# Patient Record
Sex: Male | Born: 1970 | Race: White | Hispanic: No | Marital: Married | Smoking: Never smoker
Health system: Southern US, Community
[De-identification: ages and names within clinical notes are randomized; demographics above are authoritative.]

## PROBLEM LIST (undated history)

## (undated) HISTORY — PX: INNER EAR SURGERY: SHX679

---

## 2016-06-22 ENCOUNTER — Emergency Department (HOSPITAL_COMMUNITY)
Admission: EM | Admit: 2016-06-22 | Discharge: 2016-06-22 | Disposition: A | Discharge status: Home / Self Care | Payer: Managed Care, Other (non HMO) | Attending: Emergency Medicine | Admitting: Emergency Medicine

## 2016-06-22 ENCOUNTER — Encounter (HOSPITAL_COMMUNITY): Payer: Self-pay | Admitting: Emergency Medicine

## 2016-06-22 ENCOUNTER — Emergency Department (HOSPITAL_COMMUNITY): Payer: Managed Care, Other (non HMO)

## 2016-06-22 DIAGNOSIS — Y929 Unspecified place or not applicable: Secondary | ICD-10-CM | POA: Insufficient documentation

## 2016-06-22 DIAGNOSIS — S0502XA Injury of conjunctiva and corneal abrasion without foreign body, left eye, initial encounter: Secondary | ICD-10-CM | POA: Insufficient documentation

## 2016-06-22 DIAGNOSIS — F17228 Nicotine dependence, chewing tobacco, with other nicotine-induced disorders: Secondary | ICD-10-CM | POA: Insufficient documentation

## 2016-06-22 DIAGNOSIS — Y999 Unspecified external cause status: Secondary | ICD-10-CM | POA: Insufficient documentation

## 2016-06-22 DIAGNOSIS — M795 Residual foreign body in soft tissue: Secondary | ICD-10-CM

## 2016-06-22 DIAGNOSIS — T1490XA Injury, unspecified, initial encounter: Secondary | ICD-10-CM

## 2016-06-22 DIAGNOSIS — Y939 Activity, unspecified: Secondary | ICD-10-CM | POA: Diagnosis not present

## 2016-06-22 DIAGNOSIS — S0591XA Unspecified injury of right eye and orbit, initial encounter: Secondary | ICD-10-CM | POA: Diagnosis present

## 2016-06-22 DIAGNOSIS — S0501XA Injury of conjunctiva and corneal abrasion without foreign body, right eye, initial encounter: Secondary | ICD-10-CM

## 2016-06-22 DIAGNOSIS — S40851A Superficial foreign body of right upper arm, initial encounter: Secondary | ICD-10-CM | POA: Diagnosis not present

## 2016-06-22 DIAGNOSIS — W228XXA Striking against or struck by other objects, initial encounter: Secondary | ICD-10-CM | POA: Diagnosis not present

## 2016-06-22 MED ORDER — ERYTHROMYCIN 5 MG/GM OP OINT: TOPICAL_OINTMENT | OPHTHALMIC | 0 refills | Status: AC

## 2016-06-22 MED ORDER — ERYTHROMYCIN 5 MG/GM OP OINT
TOPICAL_OINTMENT | Freq: Once | OPHTHALMIC | Status: AC
  Administered 2016-06-22: 17:00:00 via OPHTHALMIC
  Filled 2016-06-22: qty 3.5

## 2016-06-22 MED ORDER — CEPHALEXIN 500 MG PO CAPS
500.0000 mg | ORAL_CAPSULE | Freq: Four times a day (QID) | ORAL | 0 refills | Status: AC
Start: 2016-06-22 — End: ?

## 2016-06-22 MED ORDER — TETRACAINE HCL 0.5 % OP SOLN
2.0000 [drp] | Freq: Once | OPHTHALMIC | Status: AC
  Administered 2016-06-22: 2 [drp] via OPHTHALMIC
  Filled 2016-06-22: qty 4

## 2016-06-22 MED ORDER — HYDROCODONE-ACETAMINOPHEN 5-325 MG PO TABS
1.0000 | ORAL_TABLET | Freq: Once | ORAL | Status: AC
  Administered 2016-06-22: 1 via ORAL
  Filled 2016-06-22: qty 1

## 2016-06-22 MED ORDER — FLUORESCEIN SODIUM 1 MG OP STRP
1.0000 | ORAL_STRIP | Freq: Once | OPHTHALMIC | Status: AC
  Administered 2016-06-22: 1 via OPHTHALMIC
  Filled 2016-06-22: qty 1

## 2016-06-22 MED ORDER — KETOROLAC TROMETHAMINE 0.5 % OP SOLN: 1.0000 [drp] | Freq: Four times a day (QID) | OPHTHALMIC | 0 refills | Status: AC

## 2016-06-22 MED ORDER — IBUPROFEN 600 MG PO TABS
600.0000 mg | ORAL_TABLET | Freq: Four times a day (QID) | ORAL | 0 refills | Status: AC | PRN
Start: 2016-06-22 — End: ?

## 2016-06-22 MED ORDER — FLUORESCEIN SODIUM 1 MG OP STRP
3.0000 | ORAL_STRIP | Freq: Once | OPHTHALMIC | Status: AC
  Administered 2016-06-22: 3 via OPHTHALMIC
  Filled 2016-06-22: qty 3
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
## 2016-06-22 NOTE — Discharge Instructions (Signed)
You have metal in your soft tissue of the right upper extremity  - I don't think MRI is safe for you in future.....but run this by your doctor. Take the pain meds as requested along with antibiotics.  RETURN TO THE ER IF THERE IS INCREASED PAIN, REDNESS, PUS COMING OUT from the wound site.

## 2016-06-22 NOTE — ED Notes (Signed)
Patient transported to CT 

## 2016-06-22 NOTE — Progress Notes (Signed)
Patient listed [place] without [place] pcp.  [facility] who accept [employer] insurance within a 25 mile radius of patient's zip code [phone].  EDCM went to speak to patient at bedside, however patient has been discharged.
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
## 2016-06-22 NOTE — ED Notes (Signed)
Completed [facility] [employer] irrigation to left eye with 1L NS. Pt did not tolerate well. Pt refused right eye irrigation.

## 2016-06-23 MED FILL — Fluorescein Sodium Ophth Strips 1 MG: OPHTHALMIC | Qty: 3 | Status: AC
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
